# Patient Record
Sex: Female | Born: 1985 | Race: Black or African American | Hispanic: No | Marital: Single | Smoking: Never smoker
Health system: Southern US, Community
[De-identification: ages and names within clinical notes are randomized; demographics above are authoritative.]

## PROBLEM LIST (undated history)

## (undated) DIAGNOSIS — K219 Gastro-esophageal reflux disease without esophagitis: Secondary | ICD-10-CM

## (undated) HISTORY — DX: Gastro-esophageal reflux disease without esophagitis: K21.9

---

## 2009-06-30 HISTORY — PX: FRACTURE SURGERY: SHX138

## 2010-02-22 ENCOUNTER — Emergency Department (HOSPITAL_COMMUNITY): Admission: EM | Admit: 2010-02-22 | Discharge: 2010-02-23 | Payer: Self-pay | Admitting: Emergency Medicine

## 2010-03-17 ENCOUNTER — Emergency Department (HOSPITAL_COMMUNITY): Admission: EM | Admit: 2010-03-17 | Discharge: 2010-03-17 | Payer: Self-pay | Admitting: Emergency Medicine

## 2010-03-18 ENCOUNTER — Encounter: Admission: RE | Admit: 2010-03-18 | Discharge: 2010-03-18 | Payer: Self-pay | Admitting: Orthopedic Surgery

## 2010-09-13 LAB — URINALYSIS, ROUTINE W REFLEX MICROSCOPIC
Ketones, ur: NEGATIVE mg/dL
Nitrite: NEGATIVE
Protein, ur: NEGATIVE mg/dL

## 2010-09-13 LAB — GC/CHLAMYDIA PROBE AMP, GENITAL: Chlamydia, DNA Probe: NEGATIVE

## 2010-11-22 ENCOUNTER — Emergency Department (HOSPITAL_COMMUNITY)
Admission: EM | Admit: 2010-11-22 | Discharge: 2010-11-22 | Disposition: A | Discharge status: Home / Self Care | Payer: PRIVATE HEALTH INSURANCE | Attending: Emergency Medicine | Admitting: Emergency Medicine

## 2010-11-22 DIAGNOSIS — N949 Unspecified condition associated with female genital organs and menstrual cycle: Secondary | ICD-10-CM | POA: Insufficient documentation

## 2010-11-22 LAB — POCT PREGNANCY, URINE: Preg Test, Ur: NEGATIVE

## 2010-11-22 LAB — WET PREP, GENITAL
Clue Cells Wet Prep HPF POC: NONE SEEN
Trich, Wet Prep: NONE SEEN

## 2010-12-20 ENCOUNTER — Emergency Department: Payer: Self-pay | Admitting: Emergency Medicine

## 2011-04-15 IMAGING — CR DG HAND COMPLETE 3+V*R*
3 series · 3 of 3 positions shown · non-contrast
Comparison: None.

CLINICAL DATA: Trauma with pain at  fourth and fifth metacarpals.

RIGHT HAND - COMPLETE 3+ VIEW

[x hand ap right]
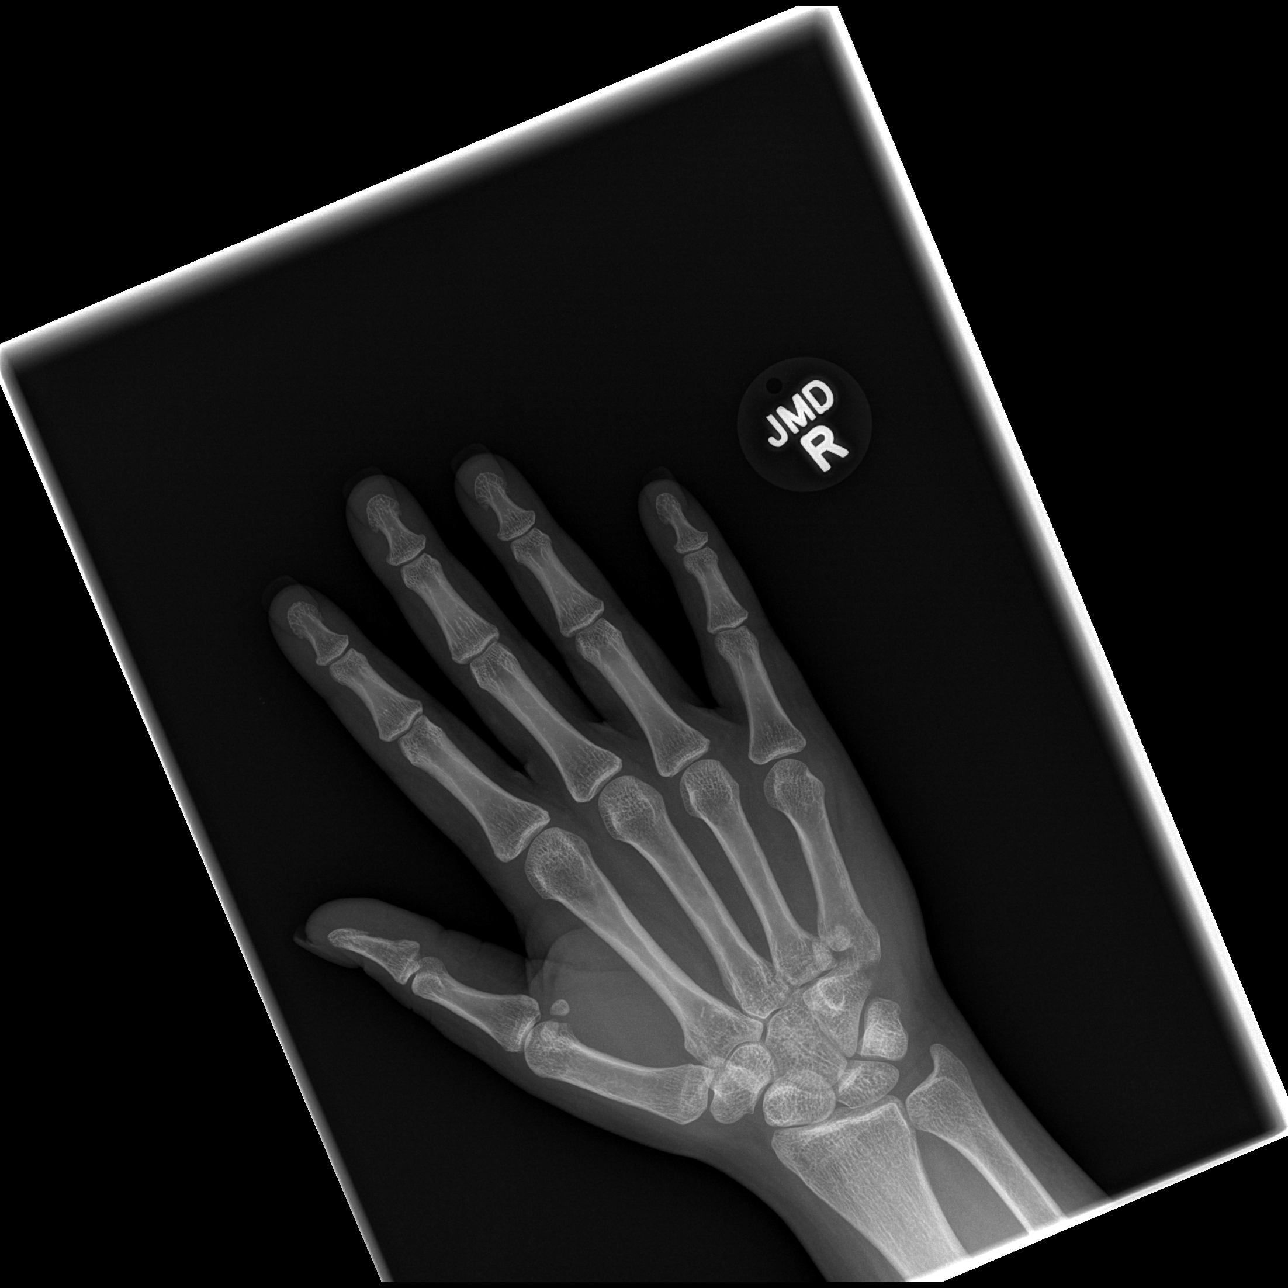

[x hand oblique right]
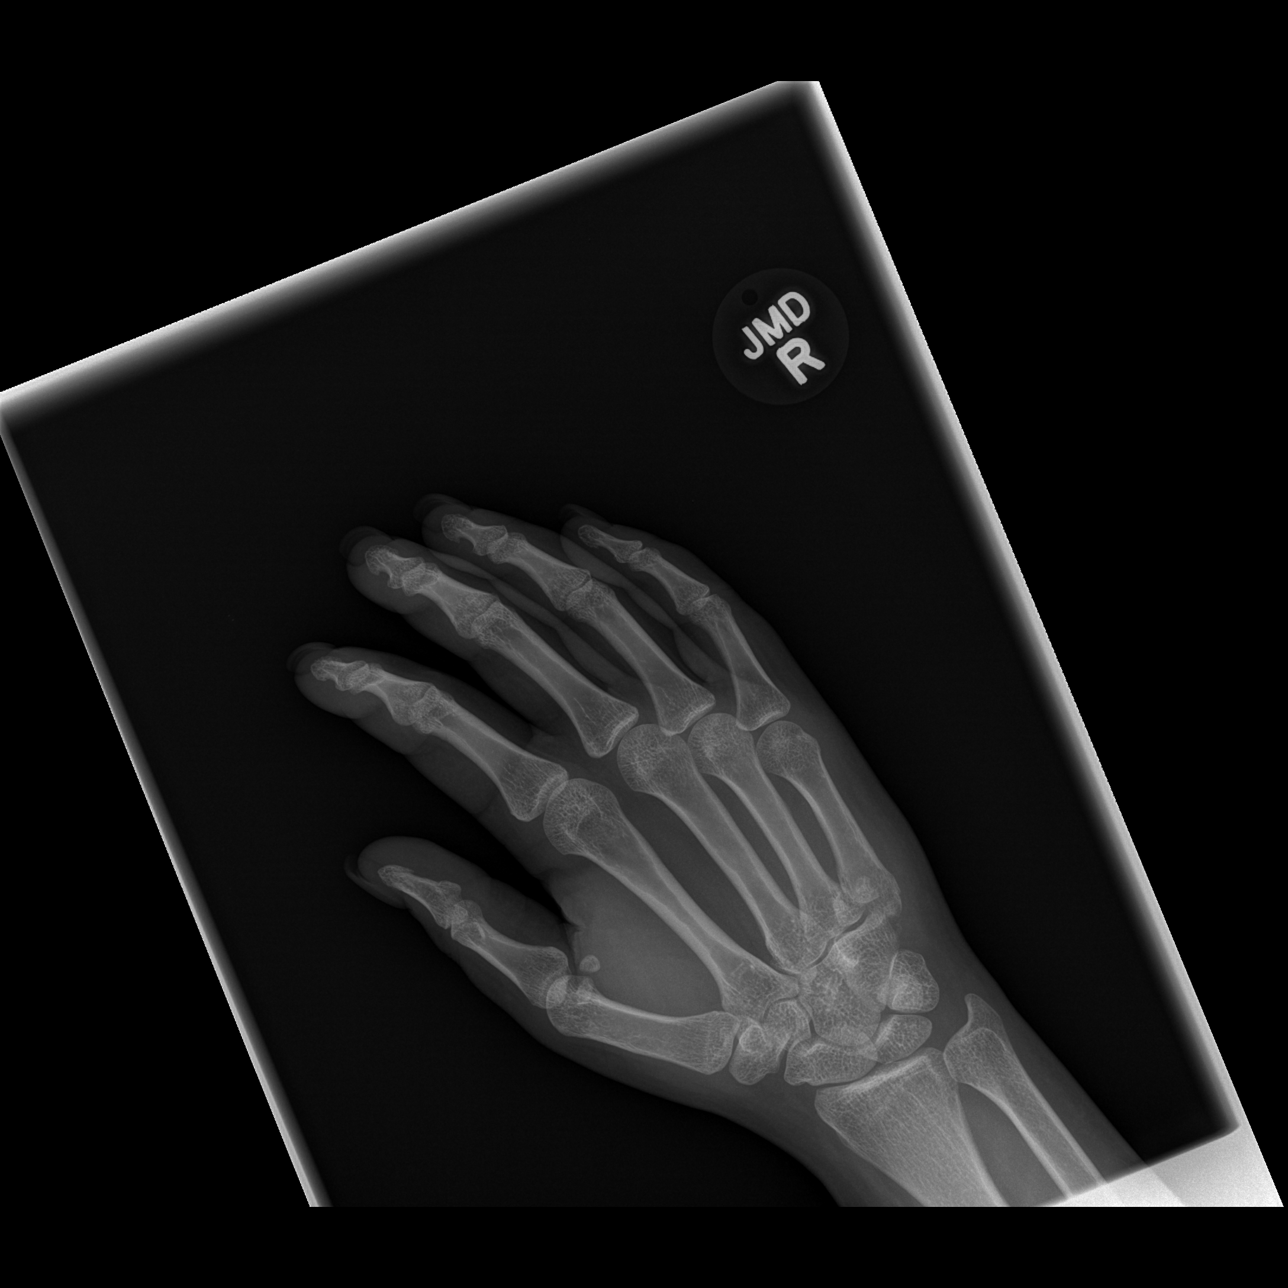

[x hand lat right]
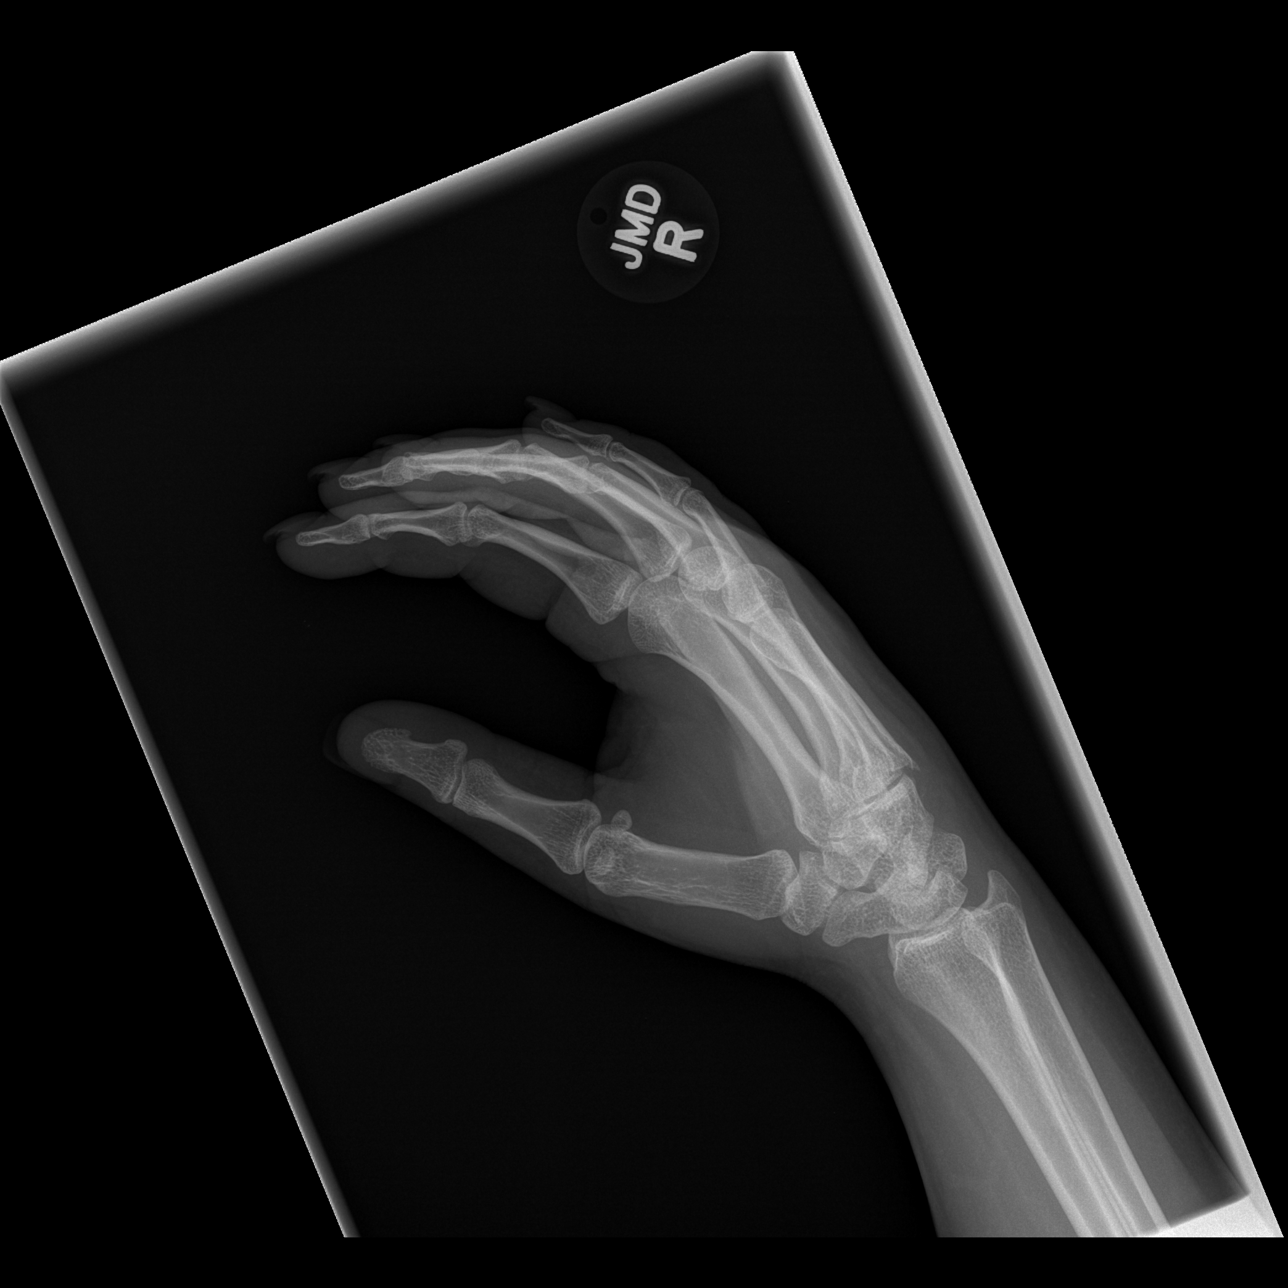

[3 of 3 positions shown; findings below may reference images not displayed]

FINDINGS: Osseous irregularity at the base of the fifth metacarpal.
Most consistent with fracture.  Likely intra-articular.  No
definite fourth metacarpal fracture.  Mild overlying soft tissue
swelling.  Attempted lateral view is oblique, with extensive
overlap of fingers.
IMPRESSION: Fracture at the base of the fifth metacarpal, likely intra-
articular.  No definite fourth metacarpal fracture identified.

## 2011-04-16 IMAGING — CT CT EXTREM UP W/O CM*R*
2 of 4 series · 4 of 14 positions shown, 5 images · non-contrast
Comparison: Radiographs 90-11.

CLINICAL DATA: Post-traumatic wrist pain and swelling.  Question
carpal metacarpal fracture.

CT OF THE RIGHT HAND WITHOUT CONTRAST
TECHNIQUE: Multidetector CT imaging of the right hand was
performed according to the standard protocol without intravenous
contrast. Multiplanar CT image reconstructions were also generated.

[Series 2: rt wrist/hand/bone · axial · 0.29mm/px · z∈[+150,+226]mm · 2 of 91 slices shown, 3 images]
[im 31/91  soft-tissue]
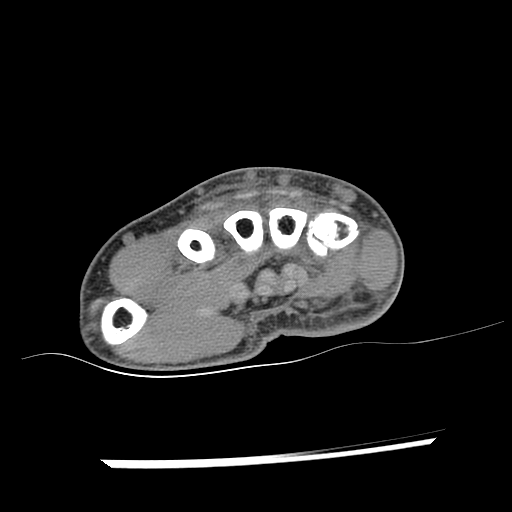
[im 31/91  bone]
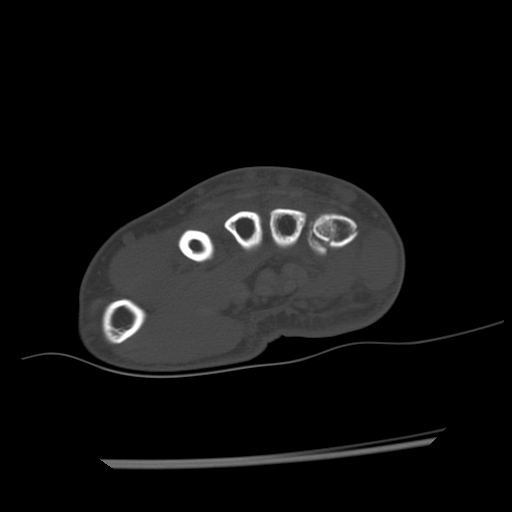
[im 61/91  bone]
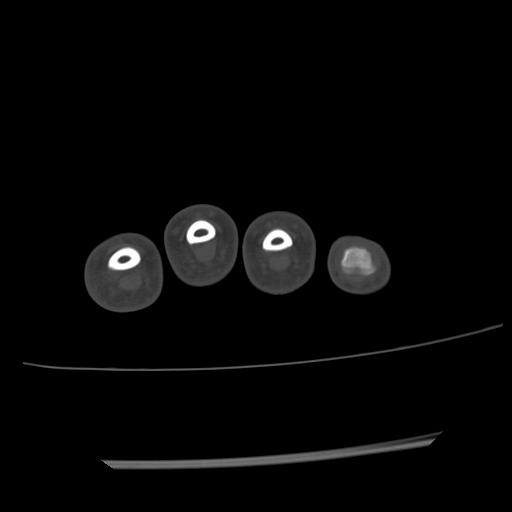

[Series 3: rt wrist/hand/detail · axial · 0.29mm/px · z∈[+150,+226]mm · 2 of 91 slices shown]
[im 31/91  bone]
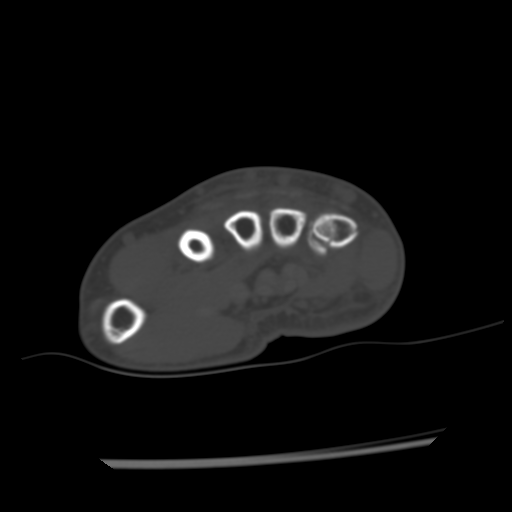
[im 61/91  bone]
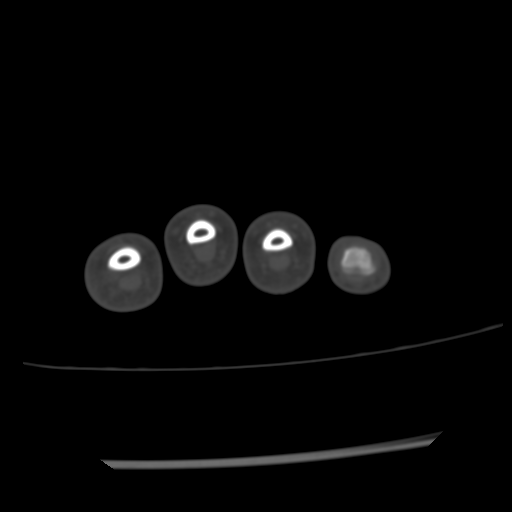

[4 of 14 positions shown; findings below may reference images not displayed]

FINDINGS: There is a displaced and mildly comminuted intra-
articular fracture involving the base of the fifth metacarpal.
This demonstrates up to 3 mm of displacement at the articular
surface with the hamate.  The remainder of the fifth metacarpal is
slightly subluxed in an ulnar direction with respect to the hamate.
There are tiny fracture fragments along the ulnar and dorsal aspect
of the hamate which probably arise from the fifth metacarpal.  No
definite hamate fracture is demonstrated.

The additional metacarpal bases and carpal bones appear normal.
The hamate hook is intact.  The digits appear normal.

There is some soft tissue swelling in the ulnar aspect of the hand
with an associated midcarpal joint effusion.
IMPRESSION: 1.  Displaced and comminuted intra-articular fracture of the fifth
metacarpal base as described.  There is slight ulnar subluxation of
the fifth metacarpal with respect to the hamate.
2.  Small fracture fragments adjacent to the hamate, likely arising
from the fifth metacarpal.  No definite carpal bone fracture.

## 2012-02-15 ENCOUNTER — Emergency Department: Payer: Self-pay | Admitting: Emergency Medicine

## 2013-04-01 ENCOUNTER — Ambulatory Visit (INDEPENDENT_AMBULATORY_CARE_PROVIDER_SITE_OTHER): Payer: BC Managed Care – PPO | Admitting: Family Medicine

## 2013-04-01 ENCOUNTER — Encounter: Payer: Self-pay | Admitting: Family Medicine

## 2013-04-01 VITALS — BP 120/80 | HR 80 | Temp 97.6°F | Resp 20 | Ht 65.5 in | Wt 228.0 lb

## 2013-04-01 DIAGNOSIS — R51 Headache: Secondary | ICD-10-CM

## 2013-04-01 DIAGNOSIS — R519 Headache, unspecified: Secondary | ICD-10-CM

## 2013-04-01 DIAGNOSIS — K219 Gastro-esophageal reflux disease without esophagitis: Secondary | ICD-10-CM

## 2013-04-01 DIAGNOSIS — R635 Abnormal weight gain: Secondary | ICD-10-CM

## 2013-04-01 DIAGNOSIS — E669 Obesity, unspecified: Secondary | ICD-10-CM

## 2013-04-01 DIAGNOSIS — M7989 Other specified soft tissue disorders: Secondary | ICD-10-CM

## 2013-04-01 DIAGNOSIS — Z23 Encounter for immunization: Secondary | ICD-10-CM

## 2013-04-01 LAB — CBC WITH DIFFERENTIAL/PLATELET
Eosinophils Relative: 1 % (ref 0–5)
Lymphocytes Relative: 26 % (ref 12–46)
Lymphs Abs: 2.1 10*3/uL (ref 0.7–4.0)
MCV: 85.2 fL (ref 78.0–100.0)
Neutro Abs: 5.4 10*3/uL (ref 1.7–7.7)
Platelets: 476 10*3/uL — ABNORMAL HIGH (ref 150–400)
RBC: 4.27 MIL/uL (ref 3.87–5.11)
WBC: 8.2 10*3/uL (ref 4.0–10.5)

## 2013-04-01 MED ORDER — FUROSEMIDE 20 MG PO TABS: 20.0000 mg | ORAL_TABLET | Freq: Every day | ORAL | Status: DC

## 2013-04-01 MED ORDER — IBUPROFEN 600 MG PO TABS: 600.0000 mg | ORAL_TABLET | Freq: Four times a day (QID) | ORAL | Status: DC | PRN

## 2013-04-01 NOTE — Patient Instructions (Addendum)
[employer] app 1500 Calories a day  No caffiene Ibuprofen 600mg  for headaches  Multivitamin 1 tablet daily  5 days a week on exercise  Labs to be done  Flu shot given  Take lasix for 3 days in a row to move the fluid  F/U 4 weeks for physical

## 2013-04-01 NOTE — Progress Notes (Signed)
  Subjective:    Patient ID: Tammy Carpenter, female    DOB: [birth year], 27 y.o.   MRN: [id number]  HPI  Pt here to establish care, previous PCP Dr. Mirna Mires in [place] No other specialist OTC zantac for heartburn. She tends to eat a lot of spicy foods setting off her heartburn Headaches- has had on and off for years, gets them maybe a few times a month, does not know what provokes them, some photophobia, relieved with OTC meds Leg swelling- for years, was on HCTZ but this did not help, no heart, kidney problems she is aware of Obesity- has gained 45 poungs in the past 7 months. 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 . Her lowest adult weight is 175lbs. She walks 4 days a week, now trying to walk run Tries to eat more fruit and veggies, some days skips breakfast. 24 hour recall- no breakfast, water only, lunch- chinese meal made in her class, with berries for snack, dinner veggie buritos- cheese, sour cream, water  Due for PAP smear   Review of Systems - per above   GEN- denies fatigue, fever, weight loss,weakness, recent illness HEENT- denies eye drainage, change in vision, nasal discharge, CVS- denies chest pain, palpitations RESP- denies SOB, cough, wheeze ABD- denies N/V, change in stools, abd pain GU- denies dysuria, hematuria, dribbling, incontinence MSK- denies joint pain, muscle aches, injury Neuro- + headache, denies dizziness, syncope, seizure activity      Objective:   Physical Exam  GEN- NAD, alert and oriented x3 HEENT- PERRL, EOMI, non injected sclera, pink conjunctiva, MMM, oropharynx clear,  Fundus benign Neck- Supple, no thryrmegaly CVS- RRR, no murmur RESP-CTAB ABD-NABS,soft,NT,ND EXT-pitting pedal edema Pulses- Radial, DP- 2+ Pysch- normal affect and mood Neuro- CNII-XII in tact, no focal deficits        Assessment & Plan:   Raised by Paternal Grandmother until age 43

## 2013-04-02 LAB — LIPID PANEL
Cholesterol: 156 mg/dL (ref 0–200)
HDL: 54 mg/dL (ref >39)
Total CHOL/HDL Ratio: 2.9 Ratio
Triglycerides: 73 mg/dL (ref <150)

## 2013-04-02 LAB — COMPREHENSIVE METABOLIC PANEL
CO2: 22 mEq/L (ref 19–32)
Creat: 0.75 mg/dL (ref 0.50–1.10)
Glucose, Bld: 70 mg/dL (ref 70–99)
Total Bilirubin: 0.5 mg/dL (ref 0.3–1.2)
Total Protein: 6.8 g/dL (ref 6.0–8.3)

## 2013-04-02 LAB — TSH: TSH: 0.691 u[IU]/mL (ref 0.350–4.500)

## 2013-04-03 DIAGNOSIS — M7989 Other specified soft tissue disorders: Secondary | ICD-10-CM | POA: Insufficient documentation

## 2013-04-03 DIAGNOSIS — R519 Headache, unspecified: Secondary | ICD-10-CM | POA: Insufficient documentation

## 2013-04-03 DIAGNOSIS — K219 Gastro-esophageal reflux disease without esophagitis: Secondary | ICD-10-CM | POA: Insufficient documentation

## 2013-04-03 DIAGNOSIS — E669 Obesity, unspecified: Secondary | ICD-10-CM | POA: Insufficient documentation

## 2013-04-03 DIAGNOSIS — R635 Abnormal weight gain: Secondary | ICD-10-CM | POA: Insufficient documentation

## 2013-04-03 NOTE — Assessment & Plan Note (Signed)
Discussed weight , proper diet activity and exercise Short term goals made

## 2013-04-03 NOTE — Assessment & Plan Note (Signed)
I think this is due to her weight and being on her feet No other signs of liver,kidney or heart failure, labs to be checked today  Lasix through the weekend

## 2013-04-03 NOTE — Assessment & Plan Note (Signed)
Continue OTC zantac

## 2013-04-03 NOTE — Assessment & Plan Note (Signed)
She has some dietary habits to improve on She did gain a significant amount of weight in a short time period Check TSH

## 2013-04-03 NOTE — Assessment & Plan Note (Signed)
Trial of ibuprofen 600mg  Remove caffiene from diet Increase exercise Headache diary

## 2013-04-29 ENCOUNTER — Ambulatory Visit (INDEPENDENT_AMBULATORY_CARE_PROVIDER_SITE_OTHER): Payer: BC Managed Care – PPO | Admitting: Family Medicine

## 2013-04-29 VITALS — BP 130/88 | HR 80 | Temp 98.3°F | Resp 18 | Wt 229.0 lb

## 2013-04-29 DIAGNOSIS — N760 Acute vaginitis: Secondary | ICD-10-CM

## 2013-04-29 DIAGNOSIS — B9689 Other specified bacterial agents as the cause of diseases classified elsewhere: Secondary | ICD-10-CM

## 2013-04-29 DIAGNOSIS — N898 Other specified noninflammatory disorders of vagina: Secondary | ICD-10-CM

## 2013-04-29 DIAGNOSIS — E669 Obesity, unspecified: Secondary | ICD-10-CM

## 2013-04-29 DIAGNOSIS — A499 Bacterial infection, unspecified: Secondary | ICD-10-CM

## 2013-04-29 DIAGNOSIS — Z124 Encounter for screening for malignant neoplasm of cervix: Secondary | ICD-10-CM

## 2013-04-29 DIAGNOSIS — N912 Amenorrhea, unspecified: Secondary | ICD-10-CM

## 2013-04-29 DIAGNOSIS — Z0000 Encounter for general adult medical examination without abnormal findings: Secondary | ICD-10-CM

## 2013-04-29 LAB — WET PREP FOR TRICH, YEAST, CLUE

## 2013-04-29 MED ORDER — FUROSEMIDE 20 MG PO TABS: 20.0000 mg | ORAL_TABLET | Freq: Every day | ORAL | Status: DC

## 2013-04-29 MED ORDER — METRONIDAZOLE 500 MG PO TABS: 500.0000 mg | ORAL_TABLET | Freq: Two times a day (BID) | ORAL | Status: DC

## 2013-04-29 NOTE — Patient Instructions (Signed)
We will call with PAP Smear results Continue with exercise F/U 6 months

## 2013-04-30 ENCOUNTER — Encounter: Payer: Self-pay | Admitting: Family Medicine

## 2013-04-30 DIAGNOSIS — N912 Amenorrhea, unspecified: Secondary | ICD-10-CM | POA: Insufficient documentation

## 2013-04-30 DIAGNOSIS — B9689 Other specified bacterial agents as the cause of diseases classified elsewhere: Secondary | ICD-10-CM | POA: Insufficient documentation

## 2013-04-30 NOTE — Assessment & Plan Note (Signed)
Treat with flagyl. 

## 2013-04-30 NOTE — Progress Notes (Signed)
  Subjective:    Patient ID: Tammy Carpenter, female    DOB: 1985/09/02, 27 y.o.   MRN: [id number]  HPI  Pt here for GYN EXAM and to f/u labs. Lasix helped some after work when she had  Swelling around ankles and feet.  LMP 6 weeks ago, asked for pregnancy test, monogamous relationship past 8 years does not use any protection. + soreness in breast, +vaginal discharge    Review of Systems  GEN- denies fatigue, fever, weight loss,weakness, recent illness HEENT- denies eye drainage, change in vision, nasal discharge, CVS- denies chest pain, palpitations RESP- denies SOB, cough, wheeze ABD- denies N/V, change in stools, abd pain GU- denies dysuria, hematuria, dribbling, incontinence MSK- denies joint pain, muscle aches, injury Neuro- denies headache, dizziness, syncope, seizure activity      Objective:   Physical Exam GEN- NAD, alert and oriented x 3 HEENT- PERRL, EOMI, non injected sclera, pink conjunctiva, MMM, oropharynx clear Neck- Supple, no thyromegaly CVS- RRR, no murmur Breast- normal symmetry, no nipple inversion,no nipple drainage, no nodules or lumps felt Nodes- no axillary nodes RESP-CTAB ABD-NABS,soft,NT,ND GU- normal external genitalia, vaginal mucosa pink and moist, cervix visualized no growth, no blood form os, +discharge, no CMT, no ovarian masses, uterus normal size EXT- No edema Pulses- Radial, DP- 2+          Assessment & Plan:   GYN Exam- PAP Smear done, labs reviewed   immunizations UTD

## 2013-04-30 NOTE — Assessment & Plan Note (Signed)
Continue to work on diet and exercise

## 2013-04-30 NOTE — Assessment & Plan Note (Signed)
Upreg negative  Advised if no menses in 1 week, repeat Upreg at home

## 2013-05-02 LAB — PAP THINPREP ASCUS RFLX HPV RFLX TYPE

## 2013-06-25 ENCOUNTER — Other Ambulatory Visit: Payer: Self-pay | Admitting: Family Medicine

## 2013-10-28 ENCOUNTER — Ambulatory Visit: Payer: BC Managed Care – PPO | Admitting: Family Medicine

## 2014-01-17 ENCOUNTER — Encounter: Payer: Self-pay | Admitting: *Deleted

## 2014-01-17 ENCOUNTER — Ambulatory Visit (INDEPENDENT_AMBULATORY_CARE_PROVIDER_SITE_OTHER): Payer: BC Managed Care – PPO | Admitting: Family Medicine

## 2014-01-17 ENCOUNTER — Encounter: Payer: Self-pay | Admitting: Family Medicine

## 2014-01-17 VITALS — BP 130/78 | HR 76 | Temp 97.9°F | Resp 12 | Wt 229.0 lb

## 2014-01-17 DIAGNOSIS — L0231 Cutaneous abscess of buttock: Secondary | ICD-10-CM

## 2014-01-17 DIAGNOSIS — L03317 Cellulitis of buttock: Secondary | ICD-10-CM

## 2014-01-17 DIAGNOSIS — L0501 Pilonidal cyst with abscess: Secondary | ICD-10-CM

## 2014-01-17 MED ORDER — HYDROCODONE-ACETAMINOPHEN 5-325 MG PO TABS: 1.0000 | ORAL_TABLET | Freq: Four times a day (QID) | ORAL | Status: DC | PRN

## 2014-01-17 NOTE — Patient Instructions (Signed)
Continue antibiotics Take pain medications Sitz bath F/U tomorrow evening for recheck

## 2014-01-17 NOTE — Progress Notes (Signed)
Patient ID: Tammy Carpenter, female   DOB: October 20, 1985, 28 y.o.   MRN: [id number]   Subjective:    Patient ID: Tammy Russellortnie Penning, female    DOB: October 20, 1985, 28 y.o.   MRN: [id number]  Patient presents for Cyst  patient here with abscess of the gluteal cleft. She states that she had a cyst that she knows of and was on antibiotics and she was in the eighth grade she's never had any difficulty since then. Last week she noticed a pimple-like lesion in her gluteal cleft she then went to the urgent care as it was starting to swell and it was tender she was given doxycycline. Over the weekend she's had significant swelling and pain she is unable to sit on her bottom without pain she's not had any drainage. She is taking Tylenol #3 which was given to her secondary to a bone contusion on her left leg appear    Review Of Systems:  GEN- denies fatigue, fever, weight loss,weakness, recent illness HEENT- denies eye drainage, change in vision, nasal discharge, CVS- denies chest pain, palpitations RESP- denies SOB, cough, wheeze ABD- denies N/V, change in stools, abd pain GU- denies dysuria, hematuria, dribbling, incontinence MSK- denies joint pain, muscle aches, injury Neuro- denies headache, dizziness, syncope, seizure activity       Objective:    BP 130/78  Pulse 76  Temp(Src) 97.9 F (36.6 C) (Oral)  Resp 12  Wt 229 lb (103.874 kg) GEN- NAD, alert and oriented x3 Skin- Gluteal cleft 4x3cm area of induration, small pits noted, fluctant region to left of cleft, TTP,   Procedure- Incision and Drainage Procedure explained to patient questions answered benefits and risks discussed verbal consent obtained. Antiseptic-Betadine Anesthesia-lidocaine 1% with Epi Incision performed large amount of pus expressed Culture taken Minimal blood loss Patient tolerated procedure well Iodoform packing 6-7inch         Assessment & Plan:      Problem List Items Addressed This Visit   None    Visit  Diagnoses   Abscess, gluteal cleft    -  Primary    S/P I and D, culture sent, sitz bath, norco given, d/c tylenol #3, work note, RTC 24 hour to remove packing and recheck, continue antibiotics    Relevant Orders       Wound culture       Note: This dictation was prepared with Dragon dictation along with smaller phrase technology. Any transcriptional errors that result from this process are unintentional.

## 2014-01-18 ENCOUNTER — Encounter: Payer: Self-pay | Admitting: Family Medicine

## 2014-01-18 ENCOUNTER — Ambulatory Visit (INDEPENDENT_AMBULATORY_CARE_PROVIDER_SITE_OTHER): Payer: BC Managed Care – PPO | Admitting: Family Medicine

## 2014-01-18 VITALS — BP 128/78 | HR 82 | Temp 98.6°F | Resp 16 | Wt 229.0 lb

## 2014-01-18 DIAGNOSIS — L0231 Cutaneous abscess of buttock: Secondary | ICD-10-CM

## 2014-01-18 DIAGNOSIS — L03317 Cellulitis of buttock: Secondary | ICD-10-CM

## 2014-01-18 NOTE — Patient Instructions (Signed)
F/U Friday for a recheck  Complete antibiotics Epson salt

## 2014-01-18 NOTE — Progress Notes (Signed)
Patient ID: Tammy Carpenter, female   DOB: Nov 26, 1985, 28 y.o.   MRN: [id number]   Subjective:    Patient ID: Tammy Carpenter, female    DOB: Nov 26, 1985, 28 y.o.   MRN: [id number]  Patient presents for F/U abscess  she had a followup we'll abscess. She was seen yesterday for gluteal abscess status post IND of the significant amount of packing in. She states her pain is much improved feels like there is less pressure. She's taken her antibiotics as well as her pain medication. She has been doing the sitz bath, no new concerns, she had a lot of drainage last night    Review Of Systems:  GEN- denies fatigue, fever, weight loss,weakness, recent illness HEENT- denies eye drainage, change in vision, nasal discharge, CVS- denies chest pain, palpitations RESP- denies SOB, cough, wheeze ABD- denies N/V, change in stools, abd pain GU- denies dysuria, hematuria, dribbling, incontinence        Objective:    BP 128/78  Pulse 82  Temp(Src) 98.6 F (37 C) (Oral)  Resp 16  Wt 229 lb (103.874 kg) GEN- NAD, alert and oriented x3 Skin- Gluteal cleft- incision draining serosanguineous fluid, induration surrounding, no erythema, mild TTP       Assessment & Plan:      Problem List Items Addressed This Visit   None    Visit Diagnoses   Abscess of gluteal cleft    -  Primary    Removed 2 inches of packing, continue antibiotics, no evidence of cellulitis, f/u 48 hours for recheck       Note: This dictation was prepared with Dragon dictation along with smaller phrase technology. Any transcriptional errors that result from this process are unintentional.

## 2014-01-20 ENCOUNTER — Encounter: Payer: Self-pay | Admitting: Family Medicine

## 2014-01-20 ENCOUNTER — Ambulatory Visit (INDEPENDENT_AMBULATORY_CARE_PROVIDER_SITE_OTHER): Payer: BC Managed Care – PPO | Admitting: Family Medicine

## 2014-01-20 VITALS — BP 128/76 | HR 72 | Temp 98.2°F | Resp 14 | Ht 65.5 in | Wt 229.0 lb

## 2014-01-20 DIAGNOSIS — L0231 Cutaneous abscess of buttock: Secondary | ICD-10-CM

## 2014-01-20 DIAGNOSIS — L03317 Cellulitis of buttock: Secondary | ICD-10-CM

## 2014-01-20 LAB — WOUND CULTURE: Gram Stain: NONE SEEN

## 2014-01-20 NOTE — Patient Instructions (Signed)
Twice a day on stiz bath Pull out more packing on Sunday , keep bandaged F/U Wed

## 2014-01-20 NOTE — Progress Notes (Signed)
Patient ID: Tammy Carpenter, female   DOB: [birth year], 28 y.o.   MRN: [id number]   Subjective:    Patient ID: Tammy Carpenter, female    DOB: [birth year], 28 y.o.   MRN: [id number]  Patient presents for F/U cyst  patient here to followup with renal abscess. She was seen 48 hours ago at that time some packing was removed. She's here with her grandmother today who might pop out to remove the iodoform packing. There's been no fever or or worsening pain. She continues to do sitz baths. She still draining a good amount from the abscess. She still taking her antibiotics    Review Of Systems:  GEN- denies fatigue, fever, weight loss,weakness, recent illness ABD- denies N/V, change in stools, abd pain        Objective:    BP 128/76  Pulse 72  Temp(Src) 98.2 F (36.8 C) (Oral)  Resp 14  Ht 5' 5.5" (1.664 m)  Wt 229 lb (103.874 kg)  BMI 37.51 kg/m2 GEN- NAD, alert and oriented x3 Skin- Gluteal cleft- incision draining serosanguineous fluid and pus,2cm area of induration surrounding incision, no erythema, mild TTP  2inches of packing removed        Assessment & Plan:      Problem List Items Addressed This Visit   None    Visit Diagnoses   Abscess, gluteal cleft    -  Primary    Cotninue care, antibiotics, packing removal 2 ich every 48 hours recheck Wed       Note: This dictation was prepared with Dragon dictation along with smaller [employer] technology. Any transcriptional errors that result from this process are unintentional.

## 2014-01-24 ENCOUNTER — Ambulatory Visit: Payer: BC Managed Care – PPO | Admitting: Podiatry

## 2014-01-25 ENCOUNTER — Encounter: Payer: Self-pay | Admitting: Family Medicine

## 2014-01-25 ENCOUNTER — Ambulatory Visit (INDEPENDENT_AMBULATORY_CARE_PROVIDER_SITE_OTHER): Payer: BC Managed Care – PPO | Admitting: Family Medicine

## 2014-01-25 VITALS — BP 122/68 | HR 78 | Temp 98.3°F | Resp 14 | Ht 65.5 in | Wt 229.0 lb

## 2014-01-25 DIAGNOSIS — L03317 Cellulitis of buttock: Secondary | ICD-10-CM

## 2014-01-25 DIAGNOSIS — L0231 Cutaneous abscess of buttock: Secondary | ICD-10-CM

## 2014-01-25 NOTE — Assessment & Plan Note (Signed)
The abscess is very much resolved. She's completed antibiotics she can continue the sitz baths and she still has a small area of induration it is slightly open this to make sure there is nothing else to drain. If this reoccurs she will be sent to general surgery for removal of a pilonidal area

## 2014-01-25 NOTE — Progress Notes (Signed)
Patient ID: Tammy Carpenter, female   DOB: 1986-02-23, 28 y.o.   MRN: [id number]   Subjective:    Patient ID: Tammy Carpenter, female    DOB: 1986-02-23, 28 y.o.   MRN: [id number]  Patient presents for F/U cyst  Patient here for recheck on her gluteal abscess. She accidentally pulled all the packing on Sunday but has not had any drainage for the past 2 days. Her pain has now resolved. She's completed her antibiotics there no new concerns.   Review Of Systems:  GEN- denies fatigue, fever, weight loss,weakness, recent illness        Objective:    BP 122/68  Pulse 78  Temp(Src) 98.3 F (36.8 C) (Oral)  Resp 14  Ht 5' 5.5" (1.664 m)  Wt 229 lb (103.874 kg)  BMI 37.51 kg/m2 GEN- NAD, alert and oriented x3 Skin- previous abscess site, partially closed, no erythema, very minimal induration at base on incision, non tender        Assessment & Plan:      Problem List Items Addressed This Visit   None      Note: This dictation was prepared with Dragon dictation along with smaller phrase technology. Any transcriptional errors that result from this process are unintentional.

## 2014-05-09 ENCOUNTER — Encounter: Payer: Self-pay | Admitting: Family Medicine

## 2014-05-09 ENCOUNTER — Ambulatory Visit (INDEPENDENT_AMBULATORY_CARE_PROVIDER_SITE_OTHER): Payer: BC Managed Care – PPO | Admitting: Family Medicine

## 2014-05-09 VITALS — BP 128/72 | HR 82 | Temp 99.2°F | Resp 14 | Ht 67.0 in | Wt 235.0 lb

## 2014-05-09 DIAGNOSIS — J209 Acute bronchitis, unspecified: Secondary | ICD-10-CM

## 2014-05-09 DIAGNOSIS — J0100 Acute maxillary sinusitis, unspecified: Secondary | ICD-10-CM

## 2014-05-09 MED ORDER — AMOXICILLIN-POT CLAVULANATE 875-125 MG PO TABS: 1.0000 | ORAL_TABLET | Freq: Two times a day (BID) | ORAL | Status: DC

## 2014-05-09 MED ORDER — GUAIFENESIN-CODEINE 100-10 MG/5ML PO SOLN: ORAL | Status: DC

## 2014-05-09 MED ORDER — FLUTICASONE PROPIONATE 50 MCG/ACT NA SUSP: 2.0000 | Freq: Every day | NASAL | Status: DC

## 2014-05-09 NOTE — Progress Notes (Signed)
Patient ID: Tammy Carpenter, female   DOB: 1985/11/21, 28 y.o.   MRN: [id number]   Subjective:    Patient ID: Tammy Carpenter, female    DOB: 1985/11/21, 28 y.o.   MRN: [id number]  Patient presents for Illness patient here with upper respiratory symptoms on and off for the past 3 months. 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 She initially started with sore throat and sinus congestion that would improve but then will worsen a few weeks later she's also had cough productive of yellow sputum. She's had a low-grade fever on and off but she feels like she cannot shake the illness altogether. She's been using over-the-counter TheraFlu and Mucinex cough medicines with no improvement. She's also had other sick college while she has been traveling. She denies any shortness of breath or chest pain no rash no nausea vomiting associated    Review Of Systems:  GEN- + fatigue, +fever, weight loss,weakness, recent illness HEENT- denies eye drainage, change in vision, +nasal discharge, CVS- denies chest pain, palpitations RESP- denies SOB, +cough, wheeze ABD- denies N/V, change in stools, abd pain GU- denies dysuria, hematuria, dribbling, incontinence MSK- denies joint pain, muscle aches, injury Neuro- denies headache, dizziness, syncope, seizure activity       Objective:    BP 128/72 mmHg  Pulse 82  Temp(Src) 99.2 F (37.3 C) (Oral)  Resp 14  Ht 5\' 7"  (1.702 m)  Wt 235 lb (106.595 kg)  BMI 36.80 kg/m2  LMP 05/02/2014 (Approximate) GEN- NAD, alert and oriented x3 HEENT- PERRL, EOMI, non injected sclera, pink conjunctiva, MMM, oropharynx mild injection, TM clear bilat no effusion,  + maxillary sinus tenderness, inflammed turbinates,  Nasal drainage  Neck- Supple, no LAD CVS- RRR, no murmur RESP-CTAB, upper airway congestion EXT- No edema Pulses- Radial 2+         Assessment & Plan:      Problem List Items  Addressed This Visit    None    Visit Diagnoses    Acute maxillary sinusitis, recurrence not specified    -  Primary    Augmentin , flonase    Relevant Medications       Guaifenesin-codeine 100-10 mg/755mL po sol       AMOXICILLIN-POT CLAVULANATE 875-125 MG PO TABS       fluticasone (FLONASE) 50 MCG nasal spray    Acute bronchitis, unspecified organism        Cough syrupm given, if no improvement CXR in 1 week       Note: This dictation was prepared with Dragon dictation along with smaller phrase technology. Any transcriptional errors that result from this process are unintentional.

## 2014-05-09 NOTE — Patient Instructions (Signed)
Start antibiotics Use cough medicine as prescribed Flonase for nasal congestion Call if not better by Monday then you will get a Chest Xray  F/U as needed

## 2014-05-18 ENCOUNTER — Ambulatory Visit: Payer: BC Managed Care – PPO | Admitting: Physician Assistant

## 2014-05-23 ENCOUNTER — Ambulatory Visit: Payer: BC Managed Care – PPO | Admitting: Family Medicine

## 2014-05-30 ENCOUNTER — Ambulatory Visit (INDEPENDENT_AMBULATORY_CARE_PROVIDER_SITE_OTHER): Payer: BC Managed Care – PPO | Admitting: Family Medicine

## 2014-05-30 ENCOUNTER — Encounter: Payer: Self-pay | Admitting: Family Medicine

## 2014-05-30 VITALS — BP 122/68 | HR 68 | Temp 98.7°F | Resp 16 | Ht 67.0 in | Wt 234.0 lb

## 2014-05-30 DIAGNOSIS — J029 Acute pharyngitis, unspecified: Secondary | ICD-10-CM

## 2014-05-30 LAB — RAPID STREP SCREEN (MED CTR MEBANE ONLY): STREPTOCOCCUS, GROUP A SCREEN (DIRECT): POSITIVE — AB

## 2014-05-30 MED ORDER — AMOXICILLIN-POT CLAVULANATE 875-125 MG PO TABS
1.0000 | ORAL_TABLET | Freq: Two times a day (BID) | ORAL | Status: DC
Start: 2014-05-30 — End: 2014-11-16

## 2014-05-30 NOTE — Progress Notes (Signed)
Patient ID: Tammy Carpenter, female   DOB: 04/22/1986, 28 y.o.   MRN: [id number]   Subjective:    Patient ID: Tammy Carpenter, female    DOB: 04/22/1986, 28 y.o.   MRN: [id number]  Patient presents for Illness  Pt seen pn 11/10 at that time had prolonged illness, with bronchitis and sinusitis, her cough has resolved, she has some mild sinus drip from allergies, this is controlled with claritin as needed and flonase. Sunday began having sore throat, felt feverish and body was aching. No cough, no sinus drainage, OTC fever meds taken    Review Of Systems: per above  GEN- denies fatigue, +fever, weight loss,weakness, recent illness HEENT- denies eye drainage, change in vision, nasal discharge, CVS- denies chest pain, palpitations RESP- denies SOB, cough, wheeze Neuro- denies headache, dizziness, syncope, seizure activity       Objective:    BP 122/68 mmHg  Pulse 68  Temp(Src) 98.7 F (37.1 C) (Oral)  Resp 16  Ht 5\' 7"  (1.702 m)  Wt 234 lb (106.142 kg)  BMI 36.64 kg/m2  LMP 05/02/2014 (Approximate) GEN- NAD, alert and oriented x3 HEENT- PERRL, EOMI, non injected sclera, pink conjunctiva, MMM, oropharynx mild injection, TM clear bilat no effusion, no sinus tenderness Neck- Supple, no LAD CVS- RRR, no murmur RESP-CTAB EXT- No edema Pulses- Radial 2+          Assessment & Plan:      Problem List Items Addressed This Visit    None    Visit Diagnoses    Acute pharyngitis, unspecified pharyngitis type    -  Primary    + strep, treat with another round of augmentin x 10 days, cepacol given, salt water gargle, ibprofen    Relevant Orders       Rapid Strep Screen       Note: This dictation was prepared with Dragon dictation along with smaller phrase technology. Any transcriptional errors that result from this process are unintentional.

## 2014-05-30 NOTE — Patient Instructions (Signed)
Take antibiotics as prescribed Salt water gargle Ibuprofen F/U as needed

## 2014-06-14 ENCOUNTER — Encounter: Payer: Self-pay | Admitting: Physician Assistant

## 2014-06-14 ENCOUNTER — Ambulatory Visit (INDEPENDENT_AMBULATORY_CARE_PROVIDER_SITE_OTHER): Payer: BC Managed Care – PPO | Admitting: Physician Assistant

## 2014-06-14 VITALS — BP 106/70 | HR 96 | Temp 98.2°F | Resp 18 | Wt 237.0 lb

## 2014-06-14 DIAGNOSIS — B3731 Acute candidiasis of vulva and vagina: Secondary | ICD-10-CM

## 2014-06-14 DIAGNOSIS — J020 Streptococcal pharyngitis: Secondary | ICD-10-CM

## 2014-06-14 DIAGNOSIS — B373 Candidiasis of vulva and vagina: Secondary | ICD-10-CM

## 2014-06-14 DIAGNOSIS — J029 Acute pharyngitis, unspecified: Secondary | ICD-10-CM

## 2014-06-14 DIAGNOSIS — R509 Fever, unspecified: Secondary | ICD-10-CM

## 2014-06-14 LAB — INFLUENZA A AND B
Inflenza A Ag: NEGATIVE
Influenza B Ag: NEGATIVE

## 2014-06-14 LAB — RAPID STREP SCREEN (MED CTR MEBANE ONLY): STREPTOCOCCUS, GROUP A SCREEN (DIRECT): POSITIVE — AB

## 2014-06-14 MED ORDER — AZITHROMYCIN 250 MG PO TABS: ORAL_TABLET | ORAL | Status: DC

## 2014-06-14 MED ORDER — FLUCONAZOLE 150 MG PO TABS: 150.0000 mg | ORAL_TABLET | Freq: Once | ORAL | Status: DC

## 2014-06-14 NOTE — Progress Notes (Signed)
Patient ID: Tammy Carpenter MRN: [id number], DOB: March 25, 1986, 28 y.o. Date of Encounter: 06/14/2014, 3:27 PM    Chief Complaint:  Chief Complaint  Patient presents with  . recurrent sickness    off and on since Sept,  sore throat, body aches  several courses of abx     HPI: 28 y.o. year old AA female with the above complaint.  I reviewed notes by Dr. Jeanice Limurham dated 05/09/2014 and 05/30/2014.  At the 05/09/14 visit she was diagnosed with maxillary sinusitis and bronchitis and was prescribed Augmentin.  At the 05/30/2014 visit her rapid strep test was positive and she was treated with another round of Augmentin for 10 days.  Today she states that she did take all of the Augmentin as directed. Says that she had a couple of days of feeling normal and asymptomatic. Then yesterday started having recurrence of symptoms. Yesterday started feeling sore throat. Today still having sore throat but also feeling some body aches and cough.   Also says that she feels like she is starting to get a yeast infection. Starting to have some vaginal irritation and a little bit of discharge.  Home Meds:   Outpatient Prescriptions Prior to Visit  Medication Sig Dispense Refill  . fluticasone (FLONASE) 50 MCG/ACT nasal spray Place 2 sprays into both nostrils daily. 16 g 1  . ibuprofen (ADVIL,MOTRIN) 600 MG tablet Take 1 tablet (600 mg total) by mouth every 6 (six) hours as needed for pain. 30 tablet 1  . ranitidine (ZANTAC) 75 MG tablet Take 75 mg by mouth 2 (two) times daily.    Marland Kitchen. amoxicillin-clavulanate (AUGMENTIN) 875-125 MG per tablet Take 1 tablet by mouth 2 (two) times daily. (Patient not taking: Reported on 06/14/2014) 20 tablet 0  . guaiFENesin-codeine 100-10 MG/5ML syrup Take 5-10 every 6 hours as needed for cough (Patient not taking: Reported on 06/14/2014) 180 mL 0  . OVER THE COUNTER MEDICATION diiurtic     No facility-administered medications prior to visit.    Allergies: No Known  Allergies    Review of Systems: See HPI for pertinent ROS. All other ROS negative.    Physical Exam: Blood pressure 106/70, pulse 96, temperature 98.2 F (36.8 C), temperature source Oral, resp. rate 18, weight 237 lb (107.502 kg)., Body mass index is 37.11 kg/(m^2). General:  Obese African-American female . Appears in no acute distress. HEENT: Normocephalic, atraumatic, eyes without discharge, sclera non-icteric, nares are without discharge. Bilateral auditory canals clear, TM's are without perforation, pearly grey and translucent with reflective cone of light bilaterally. Oral cavity moist, posterior pharynx without exudate or peritonsillar abscess. Mild erythema.   Neck: Supple. No thyromegaly. I feel no enlarged cervical lymph nodes that she does report tenderness with palpation of the tonsillar lymph nodes. Lungs: Clear bilaterally to auscultation without wheezes, rales, or rhonchi. Breathing is unlabored. Heart: Regular rhythm. No murmurs, rubs, or gallops. Msk:  Strength and tone normal for age. Extremities/Skin: Warm and dry.  No rashes. Neuro: Alert and oriented X 3. Moves all extremities spontaneously. Gait is normal. CNII-XII grossly in tact. Psych:  Responds to questions appropriately with a normal affect.   Results for orders placed or performed in visit on 06/14/14  Rapid strep screen  Result Value Ref Range   Source THROAT    Streptococcus, Group A Screen (Direct) POS (A) NEGATIVE  Influenza a and b  Result Value Ref Range   Source-INFBD NASAL    Inflenza A Ag NEG Negative   Influenza B  Ag NEG Negative     ASSESSMENT AND PLAN:  28 y.o. year old female with  1. Strep pharyngitis Since she has been on Augmentin 2 times (20 days) in the past 40 days, will now treat with Azithromycin instead.  Have told her to make sure to take the antibiotic as directed and complete all of it. I also recommend that she make sure she is getting plenty of sleep at least 8 hours a night.  Recommend using over-the-counter vitamin C and probiotics to try to boost her immune system. Can take 1 Diflucan now and another one in 5 days at the completion of antibiotic. Follow-up if symptoms do not resolve after completion of the medication. - azithromycin (ZITHROMAX) 250 MG tablet; Take 2 daily for 5 days  Dispense: 10 tablet; Refill: 0  2. Vaginal candidiasis - fluconazole (DIFLUCAN) 150 MG tablet; Take 1 tablet (150 mg total) by mouth once. Today. Repeat in 5 days.  Dispense: 2 tablet; Refill: 0  3. Sorethroat - Rapid strep screen - Influenza a and b  4. Chills with fever - Rapid strep screen - Influenza a and b   Signed, [address]Mary Beth NorwalkDixon, [place], [facility] 06/14/2014 3:27 PM

## 2014-11-16 ENCOUNTER — Encounter: Payer: Self-pay | Admitting: Physician Assistant

## 2014-11-16 ENCOUNTER — Ambulatory Visit (INDEPENDENT_AMBULATORY_CARE_PROVIDER_SITE_OTHER): Payer: 59 | Admitting: Physician Assistant

## 2014-11-16 VITALS — BP 100/70 | HR 73 | Temp 99.2°F | Resp 19 | Wt 229.0 lb

## 2014-11-16 DIAGNOSIS — H6692 Otitis media, unspecified, left ear: Secondary | ICD-10-CM

## 2014-11-16 MED ORDER — AMOXICILLIN 875 MG PO TABS
875.0000 mg | ORAL_TABLET | Freq: Two times a day (BID) | ORAL | Status: AC
Start: 2014-11-16 — End: ?

## 2014-11-16 NOTE — Progress Notes (Signed)
Patient ID: Tammy Carpenter MRN: [id number], DOB: 1986-03-09, 29 y.o. Date of Encounter: 11/16/2014, 3:10 PM    Chief Complaint:  Chief Complaint  Patient presents with  . Left ear pain    last sunday     HPI: 29 y.o. year old AA female presents with left ear pain for the past 3-4 days. Says that she really isn't blowing much mucus from her nose but says that she has been coughing up drainage that has been going down her throat--says this is thick, dark mucus. No chest congestion or sore throat no fevers or chills. Had no drainage from the ears.     Home Meds:   Outpatient Prescriptions Prior to Visit  Medication Sig Dispense Refill  . ranitidine (ZANTAC) 75 MG tablet Take 75 mg by mouth 2 (two) times daily.    Marland Kitchen. amoxicillin-clavulanate (AUGMENTIN) 875-125 MG per tablet Take 1 tablet by mouth 2 (two) times daily. (Patient not taking: Reported on 06/14/2014) 20 tablet 0  . azithromycin (ZITHROMAX) 250 MG tablet Take 2 daily for 5 days (Patient not taking: Reported on 11/16/2014) 10 tablet 0  . fluconazole (DIFLUCAN) 150 MG tablet Take 1 tablet (150 mg total) by mouth once. Today. Repeat in 5 days. (Patient not taking: Reported on 11/16/2014) 2 tablet 0  . fluticasone (FLONASE) 50 MCG/ACT nasal spray Place 2 sprays into both nostrils daily. (Patient not taking: Reported on 11/16/2014) 16 g 1  . guaiFENesin-codeine 100-10 MG/5ML syrup Take 5-10 every 6 hours as needed for cough (Patient not taking: Reported on 06/14/2014) 180 mL 0  . ibuprofen (ADVIL,MOTRIN) 600 MG tablet Take 1 tablet (600 mg total) by mouth every 6 (six) hours as needed for pain. (Patient not taking: Reported on 11/16/2014) 30 tablet 1  . OVER THE COUNTER MEDICATION diiurtic     No facility-administered medications prior to visit.    Allergies: No Known Allergies    Review of Systems: See HPI for pertinent ROS. All other ROS negative.    Physical Exam: Blood pressure 100/70, pulse 73, temperature 99.2 F (37.3  C), temperature source Oral, resp. rate 19, weight 229 lb (103.874 kg)., Body mass index is 35.86 kg/(m^2). General:  Overweight African-American female . Appears in no acute distress. HEENT: Normocephalic, atraumatic, eyes without discharge, sclera non-icteric, nares are without discharge. Bilateral auditory canals clear with no purulent drainage, no edema. Left TM : Superior portion with red erythema. Inferior portion with golden color and dull.   Right TM also slightly dull and with light golden color. Oral cavity moist, posterior pharynx without exudate, erythema, peritonsillar abscess.  Neck: Supple. No thyromegaly. No lymphadenopathy. Lungs: Clear bilaterally to auscultation without wheezes, rales, or rhonchi. Breathing is unlabored. Heart: Regular rhythm. No murmurs, rubs, or gallops. Msk:  Strength and tone normal for age. Extremities/Skin: Warm and dry. Neuro: Alert and oriented X 3. Moves all extremities spontaneously. Gait is normal. CNII-XII grossly in tact. Psych:  Responds to questions appropriately with a normal affect.     ASSESSMENT AND PLAN:  29 y.o. year old female with  1. Acute left otitis media, recurrence not specified, unspecified otitis media type Take antibiotic as directed and complete all of it. Also take Tylenol and Motrin for 48 hours to relieve pain. Follow up if symptoms do not resolve with completion of antibiotic. - amoxicillin (AMOXIL) 875 MG tablet; Take 1 tablet (875 mg total) by mouth 2 (two) times daily.  Dispense: 14 tablet; Refill: 0   Signed, Frazier RichardsMary Beth Creasie Lacosse,  [place], BSFM 11/16/2014 3:10 PM

## 2014-11-20 ENCOUNTER — Telehealth: Payer: Self-pay | Admitting: *Deleted

## 2014-11-20 MED ORDER — OFLOXACIN 0.3 % OT SOLN: 5.0000 [drp] | Freq: Every day | OTIC | Status: AC

## 2014-11-20 NOTE — Telephone Encounter (Signed)
Received call from patient.   Reports that she was seen last week for inner ear infection. Reports that she was given Amoxicillin for treatment.   States that she now has thick white drainage from L ear that she thinks may be pus. Reports pain at level of 3-4 in L ear.   Advised to continua ABTx.   Please advise.

## 2014-11-20 NOTE — Telephone Encounter (Signed)
Pt called.  Aware stay on oral antibiotic and Rx for ear drops to pharmacy near work

## 2014-11-20 NOTE — Telephone Encounter (Signed)
Complete the amoxicillin. Add Floxin Otic 10 drops in the ear daily.--- Dispense #10 mL +0 refill

## 2014-12-22 ENCOUNTER — Encounter: Payer: Self-pay | Admitting: Family Medicine

## 2015-01-12 ENCOUNTER — Ambulatory Visit: Payer: 59 | Admitting: Family Medicine

## 2015-01-15 ENCOUNTER — Ambulatory Visit: Payer: BC Managed Care – PPO | Admitting: Physician Assistant
